# Patient Record
Sex: Female | Born: 1985 | Race: White | Hispanic: No | Marital: Single | Smoking: Never smoker
Health system: Southern US, Community
[De-identification: ages and names within clinical notes are randomized; demographics above are authoritative.]

---

## 2021-05-03 ENCOUNTER — Emergency Department (HOSPITAL_BASED_OUTPATIENT_CLINIC_OR_DEPARTMENT_OTHER): Payer: BC Managed Care – PPO

## 2021-05-03 ENCOUNTER — Other Ambulatory Visit: Payer: Self-pay

## 2021-05-03 ENCOUNTER — Emergency Department (HOSPITAL_BASED_OUTPATIENT_CLINIC_OR_DEPARTMENT_OTHER)
Admission: EM | Admit: 2021-05-03 | Discharge: 2021-05-03 | Disposition: A | Discharge status: Home / Self Care | Payer: BC Managed Care – PPO | Attending: Emergency Medicine | Admitting: Emergency Medicine

## 2021-05-03 ENCOUNTER — Encounter (HOSPITAL_BASED_OUTPATIENT_CLINIC_OR_DEPARTMENT_OTHER): Payer: Self-pay | Admitting: *Deleted

## 2021-05-03 DIAGNOSIS — R072 Precordial pain: Secondary | ICD-10-CM | POA: Insufficient documentation

## 2021-05-03 DIAGNOSIS — R002 Palpitations: Secondary | ICD-10-CM | POA: Insufficient documentation

## 2021-05-03 DIAGNOSIS — R0602 Shortness of breath: Secondary | ICD-10-CM | POA: Insufficient documentation

## 2021-05-03 DIAGNOSIS — R5383 Other fatigue: Secondary | ICD-10-CM | POA: Diagnosis not present

## 2021-05-03 LAB — CBC
HCT: 42.3 % (ref 36.0–46.0)
Hemoglobin: 14.5 g/dL (ref 12.0–15.0)
MCH: 30.5 pg (ref 26.0–34.0)
MCHC: 34.3 g/dL (ref 30.0–36.0)
MCV: 89.1 fL (ref 80.0–100.0)
Platelets: 200 10*3/uL (ref 150–400)
RBC: 4.75 MIL/uL (ref 3.87–5.11)
RDW: 12.8 % (ref 11.5–15.5)
WBC: 4.1 10*3/uL (ref 4.0–10.5)
nRBC: 0 % (ref 0.0–0.2)

## 2021-05-03 LAB — BASIC METABOLIC PANEL
Anion gap: 6 (ref 5–15)
BUN: 10 mg/dL (ref 6–20)
CO2: 23 mmol/L (ref 22–32)
Calcium: 8.7 mg/dL — ABNORMAL LOW (ref 8.9–10.3)
Chloride: 107 mmol/L (ref 98–111)
Creatinine, Ser: 0.76 mg/dL (ref 0.44–1.00)
GFR, Estimated: >60 mL/min (ref >60)
Glucose, Bld: 105 mg/dL — ABNORMAL HIGH (ref 70–99)
Potassium: 3.4 mmol/L — ABNORMAL LOW (ref 3.5–5.1)
Sodium: 136 mmol/L (ref 135–145)

## 2021-05-03 LAB — TROPONIN I (HIGH SENSITIVITY): Troponin I (High Sensitivity): 2 ng/L (ref <18)

## 2021-05-03 LAB — PREGNANCY, URINE: Preg Test, Ur: NEGATIVE

## 2021-05-03 MED ORDER — HYDROXYZINE HCL 25 MG PO TABS: 25.0000 mg | ORAL_TABLET | Freq: Four times a day (QID) | ORAL | 0 refills | Status: AC | PRN

## 2021-05-03 NOTE — ED Notes (Signed)
Resting quietly in room, awaiting lab results, cont to demonstrate NSR without ectopy on the monitor.

## 2021-05-03 NOTE — ED Triage Notes (Signed)
Yesterday at work, having "chills", felt weak. States BP was elevated. Has had an increase in stress in family and work. Currently has chest pressure on left side of chest. Non-radiating, no current nausea or vomiting. States some shortness of breath, last night had indigestion. ECG done immediately upon arrival to room

## 2021-05-03 NOTE — ED Provider Notes (Signed)
Emergency Department Provider Note   I have reviewed the triage vital signs and the nursing notes.   HISTORY  Chief Complaint Chest Pain   HPI Nohemy Kleinow is a 36 y.o. female presents to the ED with CP, chills, and fatigue. Symptoms began yesterday. No pleuritic pain. Pain is pressure like and non-radiating. Some SOB noted as well. No URI symptoms. No diaphoresis or vomiting. No exertional pain. She self describes some "anxiety" type symptoms along with palpitations. She notes that she wears an Apple watch which has recorded HR values into the 140s but her watch does not provide information regarding rhythm.     No past medical history on file.  Review of Systems  Constitutional: No fever/chills. Positive fatigue.  Eyes: No visual changes. ENT: No sore throat. Cardiovascular: Positive chest pain. Respiratory: Positive shortness of breath. Gastrointestinal: No abdominal pain.  No nausea, no vomiting.  No diarrhea.  No constipation. Genitourinary: Negative for dysuria. Musculoskeletal: Negative for back pain. Skin: Negative for rash. Neurological: Negative for headaches, focal weakness or numbness.   ____________________________________________   PHYSICAL EXAM:  VITAL SIGNS: ED Triage Vitals  Enc Vitals Group     BP 05/03/21 0858 (!) 129/97     Pulse Rate 05/03/21 0858 96     Resp 05/03/21 0858 (!) 22     Temp 05/03/21 0858 98.1 F (36.7 C)     Temp Source 05/03/21 0858 Oral     SpO2 05/03/21 0858 98 %     Weight 05/03/21 0902 132 lb (59.9 kg)     Height 05/03/21 0902 5\' 3"  (1.6 m)   Constitutional: Alert and oriented. Well appearing and in no acute distress. Eyes: Conjunctivae are normal.  Head: Atraumatic. Nose: No congestion/rhinnorhea. Mouth/Throat: Mucous membranes are moist.  Neck: No stridor.   Cardiovascular: Normal rate, regular rhythm. Good peripheral circulation. Grossly normal heart sounds.   Respiratory: Normal respiratory effort.  No  retractions. Lungs CTAB. Gastrointestinal: Soft and nontender. No distention.  Musculoskeletal: No lower extremity tenderness nor edema. No gross deformities of extremities. Neurologic:  Normal speech and language. No gross focal neurologic deficits are appreciated.  Skin:  Skin is warm, dry and intact. No rash noted.  ____________________________________________   LABS (all labs ordered are listed, but only abnormal results are displayed)  Labs Reviewed  BASIC METABOLIC PANEL - Abnormal; Notable for the following components:      Result Value   Potassium 3.4 (*)    Glucose, Bld 105 (*)    Calcium 8.7 (*)    All other components within normal limits  CBC  PREGNANCY, URINE  TROPONIN I (HIGH SENSITIVITY)   ____________________________________________  EKG   EKG Interpretation  Date/Time:  Friday May 03 2021 08:59:19 EST Ventricular Rate:  81 PR Interval:  134 QRS Duration: 83 QT Interval:  393 QTC Calculation: 457 R Axis:   77 Text Interpretation: Sinus rhythm Baseline wander in lead(s) V6 Confirmed by Nanda Quinton [phone]) on 05/03/2021 9:15:17 AM       ____________________________________________   PROCEDURES  Procedure(s) performed:   Procedures  None ____________________________________________   INITIAL IMPRESSION / ASSESSMENT AND PLAN / ED COURSE  Pertinent labs & imaging results that were available during my care of the patient were reviewed by me and considered in my medical decision making (see chart for details).   This patient is Presenting for Evaluation of CP, which does require a range of treatment options, and is a complaint that involves a high risk of morbidity  and mortality.  The Differential Diagnoses includes all life-threatening causes for chest pain. This includes but is not exclusive to acute coronary syndrome, aortic dissection, pulmonary embolism, cardiac tamponade, community-acquired pneumonia, pericarditis, musculoskeletal chest  wall pain, etc.   I decided to review pertinent External Data, and in summary no recent ED visits for similar. No cath in the past. .   Clinical Laboratory Tests Ordered, included Troponin negative. No AKI. Electrolytes not significantly abnormal. CBC unremarkable. Pregnancy negative.   Radiologic Tests Ordered, included CXR. I independently interpreted the images and agree with radiology interpretation.   Cardiac Monitor Tracing which shows NSR.   Social Determinants of Health Risk patient is a non-smoker.   Medical Decision Making: Summary:  Patient presents to the ED with CP and fatigue. Patient describes some "anxiety" type symptoms as well. Offered Atarax for symptom mgmt but advised close PCP follow up. Discussed that I cannot fully diagnose anxiety in the ED. Patient describes some episodes of palpitations and apple watch recording HR in the 140s at times. May benefit from Cardiology referral for ambulatory monitoring. HR WNL here and sinus rhythm.   Reevaluation with update and discussion with patient regarding results and plan for Cardiology referral for palpitations and elevated HR at home.   Disposition: discharge  ____________________________________________  FINAL CLINICAL IMPRESSION(S) / ED DIAGNOSES  Final diagnoses:  Precordial chest pain  Palpitations     NEW OUTPATIENT MEDICATIONS STARTED DURING THIS VISIT:  Discharge Medication List as of 05/03/2021 10:18 AM     START taking these medications   Details  hydrOXYzine (ATARAX) 25 MG tablet Take 1 tablet (25 mg total) by mouth every 6 (six) hours as needed for anxiety., Starting Fri 05/03/2021, Normal        Note:  This document was prepared using Dragon voice recognition software and may include unintentional dictation errors.  Nanda Quinton, MD, [facility]    Ellana Kawa, [facility], MD 05/04/21 [phone]

## 2021-05-03 NOTE — Discharge Instructions (Signed)
You were seen in the emergency room today with heart palpitations and chest discomfort.  Your lab work here is reassuring.  I am starting on some medication to take as needed for anxiety.  I have also placed a referral to our cardiology group with palpitations and heart rates at home in the 140s.  They may consider further testing such as ambulatory heart monitoring for further evaluation of this.  If you develop any new or suddenly worsening symptoms please return to the emergency department for reevaluation.

## 2023-03-19 IMAGING — CR DG CHEST 2V
2 series · 2 of 2 positions shown · non-contrast
Comparison: None.

CLINICAL DATA: Left anterior chest pain, hypertension.

EXAM:
CHEST - 2 VIEW

[w chest pa]
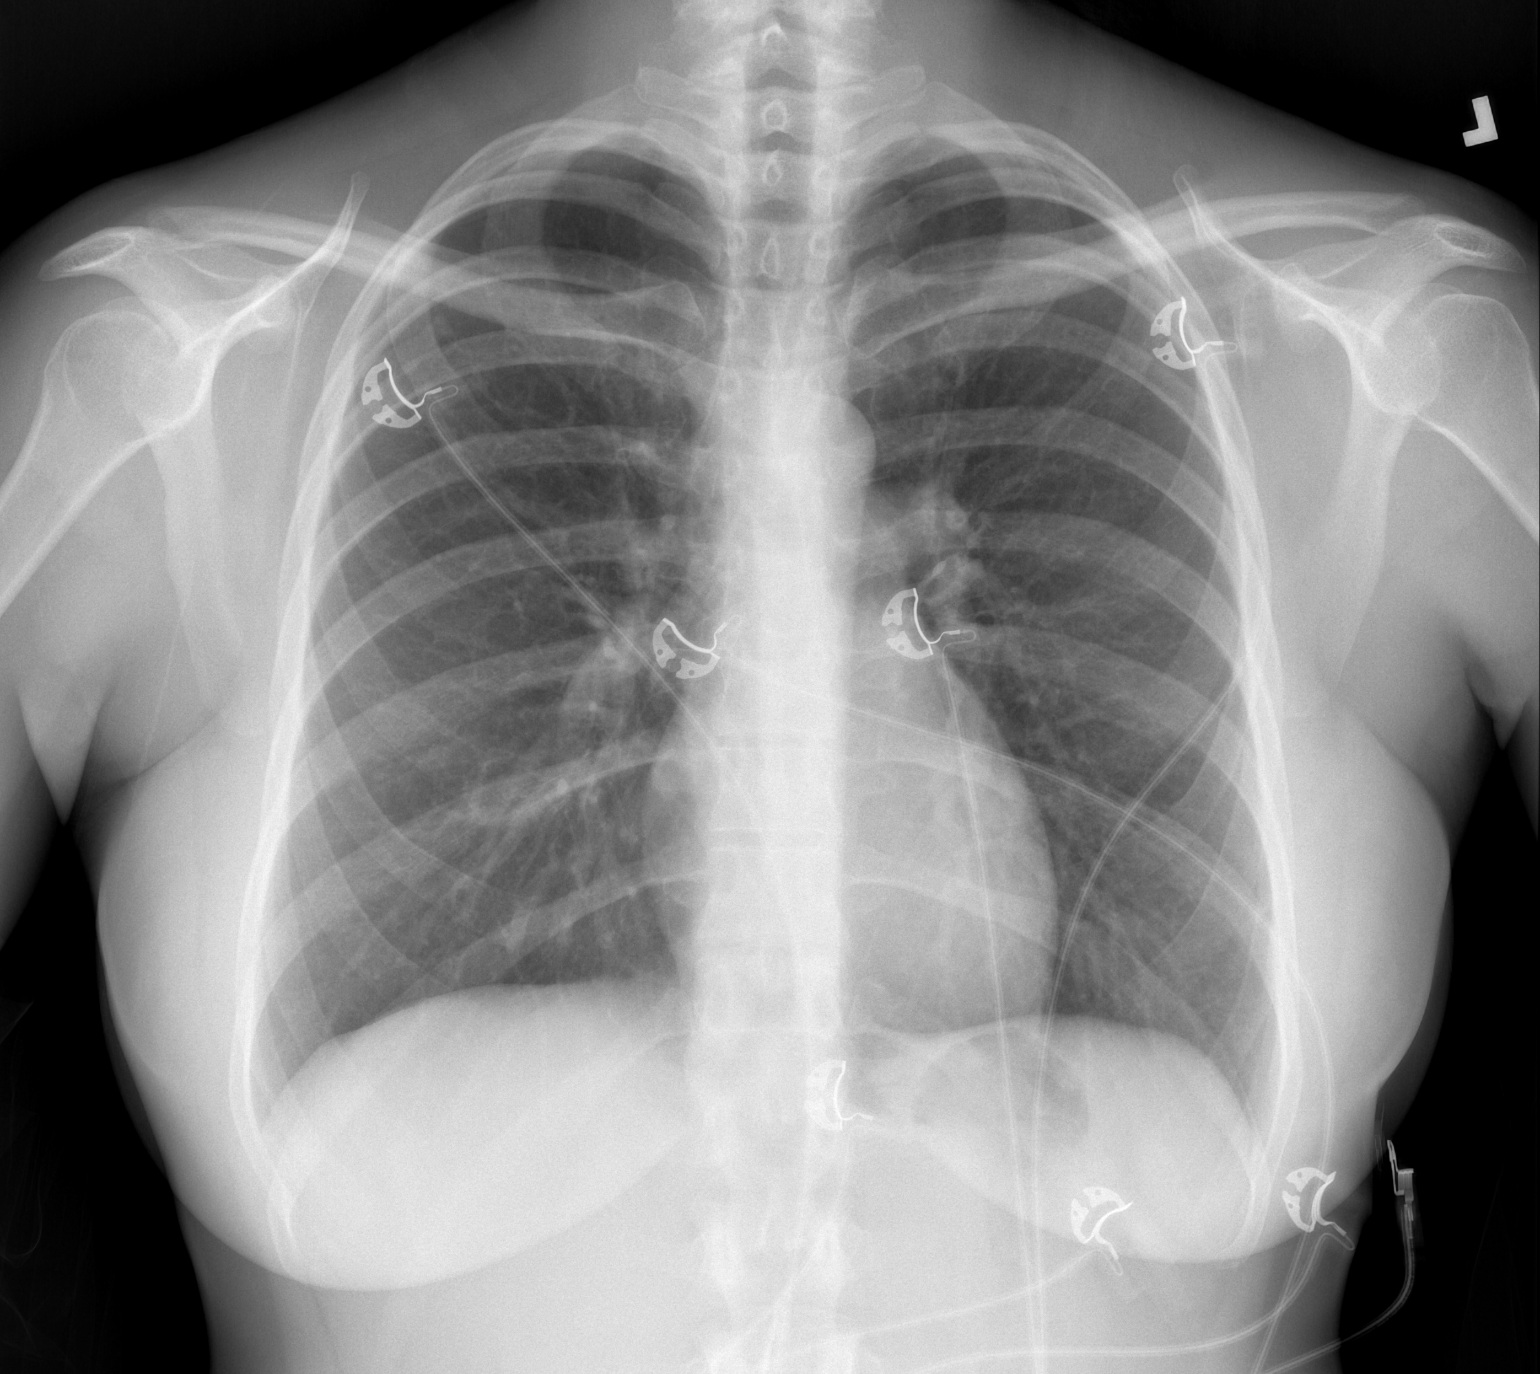

[w chest lat]
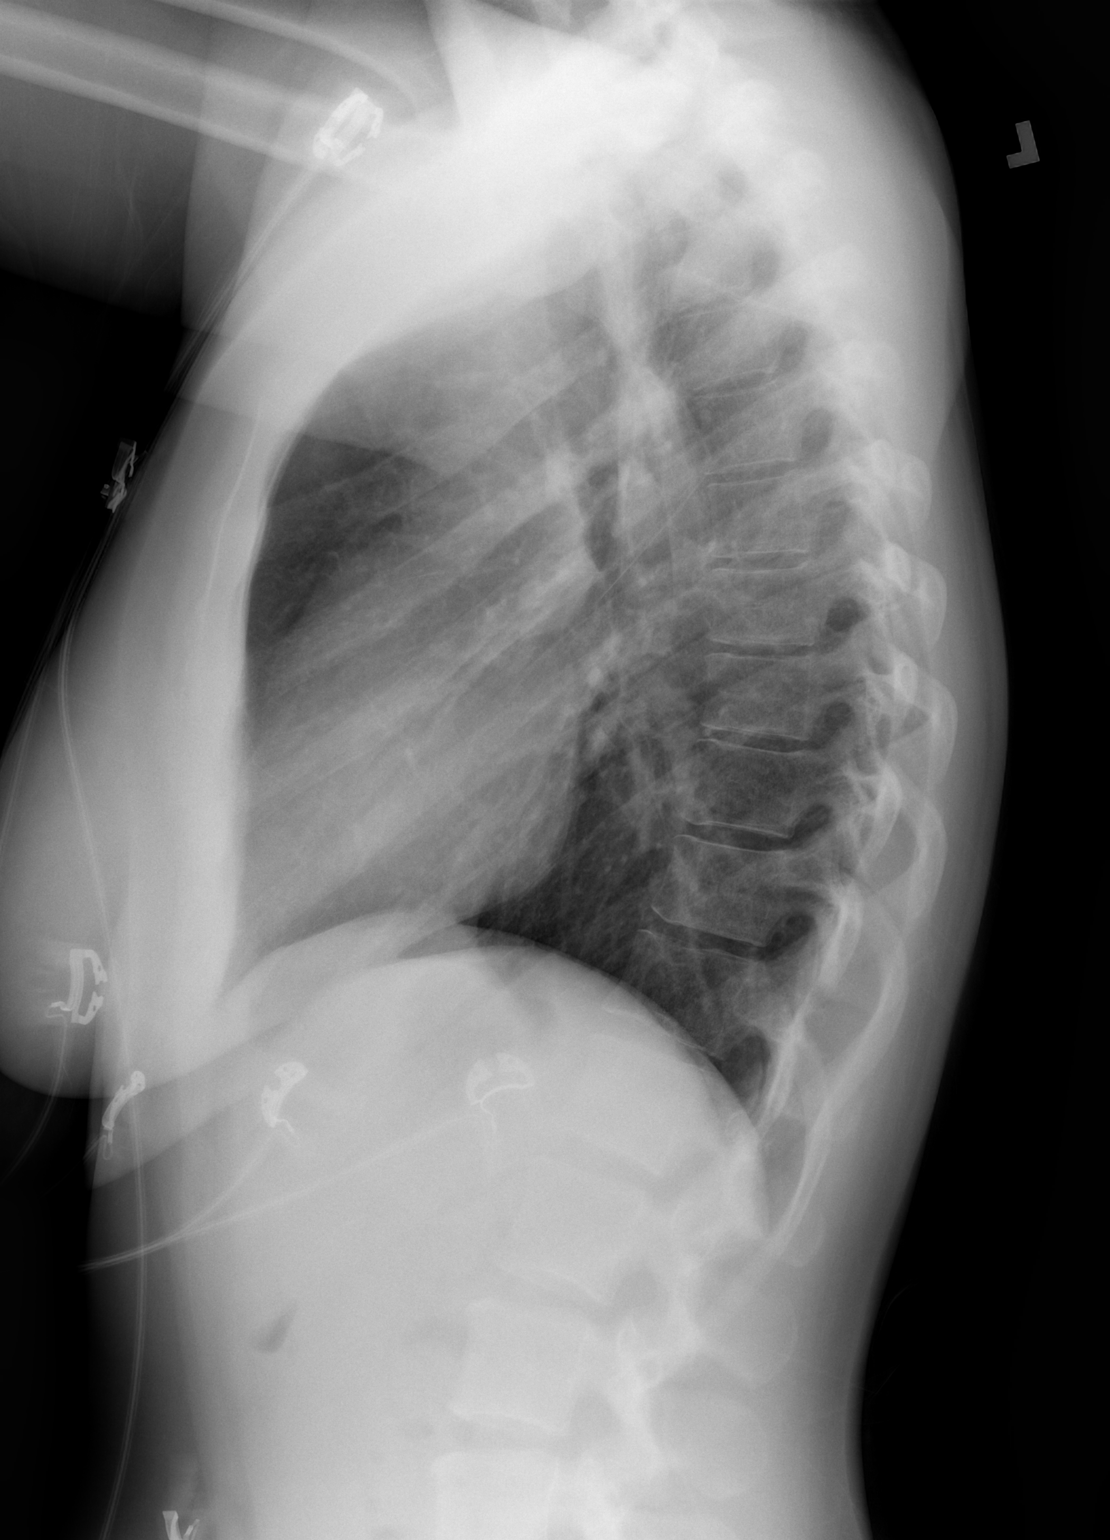

[2 of 2 positions shown; findings below may reference images not displayed]

FINDINGS: Trachea is midline. Heart size normal. Lungs are clear. No pleural
fluid.
IMPRESSION: No acute findings.
